# Patient Record
Sex: Female | Born: 1995 | Race: White | Hispanic: No | Marital: Married | Smoking: Never smoker
Health system: Southern US, Community
[De-identification: ages and names within clinical notes are randomized; demographics above are authoritative.]

## PROBLEM LIST (undated history)

## (undated) HISTORY — PX: FEMUR BIOPSY: SHX1592

---
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
## 2016-12-05 ENCOUNTER — Encounter: Payer: Self-pay | Admitting: Emergency Medicine

## 2016-12-05 ENCOUNTER — Other Ambulatory Visit: Payer: Self-pay

## 2016-12-05 ENCOUNTER — Emergency Department
Admission: EM | Admit: 2016-12-05 | Discharge: 2016-12-05 | Disposition: A | Discharge status: Home / Self Care | Payer: Managed Care, Other (non HMO) | Attending: Emergency Medicine | Admitting: Emergency Medicine

## 2016-12-05 DIAGNOSIS — Z79899 Other long term (current) drug therapy: Secondary | ICD-10-CM | POA: Diagnosis not present

## 2016-12-05 DIAGNOSIS — N764 Abscess of vulva: Secondary | ICD-10-CM | POA: Diagnosis not present

## 2016-12-05 DIAGNOSIS — R1031 Right lower quadrant pain: Secondary | ICD-10-CM | POA: Diagnosis present

## 2016-12-05 MED ORDER — LIDOCAINE HCL (PF) 1 % IJ SOLN
INTRAMUSCULAR | Status: AC
  Filled 2016-12-05: qty 5

## 2016-12-05 MED ORDER — SULFAMETHOXAZOLE-TRIMETHOPRIM 800-160 MG PO TABS: 1.0000 | ORAL_TABLET | Freq: Two times a day (BID) | ORAL | 0 refills | Status: AC

## 2016-12-05 MED ORDER — CEPHALEXIN 250 MG PO CAPS
250.0000 mg | ORAL_CAPSULE | Freq: Four times a day (QID) | ORAL | 0 refills | Status: AC
Start: 2016-12-05 — End: 2016-12-15

## 2016-12-05 NOTE — ED Triage Notes (Signed)
Abscess in groin

## 2016-12-05 NOTE — Discharge Instructions (Addendum)
You have been seen in the Emergency Department (ED) today for an abscess.  This was drained in the ED.  Please follow up with your doctor or in the ED in 24-48 hours for recheck of your wound.  Read through the additional discharge instructions included below regarding wound care recommendations.  Please follow-up with [facility] Monday or Tuesday for a wound check and to have your packing removed.  Call your doctor sooner or return to the ED if you develop worsening signs of infection such as: increased redness, increased pain, pus, or fever.

## 2016-12-05 NOTE — ED Provider Notes (Signed)
[facility] Emergency Department Provider Note   ____________________________________________   First MD Initiated Contact with Patient 12/05/16 1247     (approximate)  I have reviewed the triage vital signs and the nursing notes.   HISTORY  Chief Complaint Abscess    HPI Meghan Butler is a 21 y.o. female presents for evaluation of pain in the right lower groin.  Patient reports about 2 days ago she began realizing that she was having pain and discomfort around the right groin at the entrance of the vagina.  She was seen at student [facility] Where they diagnosed her with an abscess, she did not have it drained but they did squeeze it and were able to get a small amount of pus to come out.  We then placed her on Bactrim 1 tablet daily and she reports the swelling has continued to increase in pain increasing as well  No fevers or chills.  No nausea vomiting.  Denies pregnancy.  No urinary symptoms.  She reports is uncomfortable to walk.  No history of any abscess in the past.  No known drug allergies.  Currently on Bactrim 1 tablet twice a day  History reviewed. No pertinent past medical history.  There are no active problems to display for this patient.   Past Surgical History:  Procedure Laterality Date  . FEMUR BIOPSY      Prior to Admission medications   Medication Sig Start Date End Date Taking? Authorizing Provider  cephALEXin (KEFLEX) 250 MG capsule Take 1 capsule (250 mg total) by mouth 4 (four) times daily for 10 days. 12/05/16 12/15/16  Sharyn CreamerQuale, Sadako Cegielski, MD  HYDROcodone-acetaminophen (NORCO/VICODIN) 5-325 MG per tablet Take 2 tablets by mouth every 4 (four) hours as needed for moderate pain or severe pain. 12/10/13   Mesner, Barbara CowerJason, MD  Norethindrone Acetate-Ethinyl Estrad-FE (LOMEDIA 24 FE) 1-20 MG-MCG(24) tablet Take 1 tablet by mouth daily.    [provider]  sulfamethoxazole-trimethoprim (BACTRIM DS,SEPTRA DS) 800-160 MG tablet Take 1  tablet by mouth 2 (two) times daily. 12/05/16   Sharyn CreamerQuale, Heriberto Stmartin, MD    Allergies Patient has no known allergies.  History reviewed. No pertinent family history.  Social History Social History   Tobacco Use  . Smoking status: Never Smoker  . Smokeless tobacco: Never Used  Substance Use Topics  . Alcohol use: Yes    Comment: socially  . Drug use: No    Review of Systems Constitutional: No fever/chills Eyes: No visual changes. ENT: No sore throat. Cardiovascular: Denies chest pain. Respiratory: Denies shortness of breath. Gastrointestinal: No abdominal pain.  No nausea, no vomiting.  No diarrhea.  No constipation. Genitourinary: Negative for dysuria. Musculoskeletal: Negative for back pain. Skin: See HPI Neurological: Negative for headaches, focal weakness or numbness.    ____________________________________________   PHYSICAL EXAM:  VITAL SIGNS: ED Triage Vitals  Enc Vitals Group     BP 12/05/16 1151 118/74     Pulse Rate 12/05/16 1151 (!) 103     Resp 12/05/16 1151 (!) 22     Temp 12/05/16 1151 98.6 F (37 C)     Temp Source 12/05/16 1151 Oral     SpO2 12/05/16 1151 99 %     Weight 12/05/16 1156 135 lb (61.2 kg)     Height 12/05/16 1156 5\' 6"  (1.676 m)     Head Circumference --      Peak Flow --      Pain Score 12/05/16 1151 7  Pain Loc --      Pain Edu? --      Excl. in GC? --     Constitutional: Alert and oriented. Well appearing and in no acute distress. Eyes: Conjunctivae are normal. Head: Atraumatic. Nose: No congestion/rhinnorhea. Mouth/Throat: Mucous membranes are moist. Neck: No stridor.   Cardiovascular: Normal rate, regular rhythm.  Respiratory: Normal respiratory effort.  No retractions. Lungs CTAB. Gastrointestinal: Soft and nontender. No distention. Musculoskeletal: No lower extremity tenderness nor edema. Genitourinary: Examined with nurse Felicia throughout.  The left labia is normal.  The introitus appears normal.  The right labia  has an obvious area of induration and swelling at the anterior portion of the labia majora.  There is no swelling in the area of the Bartholin's gland.  The patient does have mild overlying erythema which is primarily seated over the area that is swollen and about 3 cm x 2 cm with minimal extension.  There is no extension into the remainder of the perineum.  No crepitance.  There is no purulence or drainage at this time Neurologic:  Normal speech and language. No gross focal neurologic deficits are appreciated.  Skin:  Skin is warm, dry and intact. No rash noted. Psychiatric: Mood and affect are normal. Speech and behavior are normal.  ____________________________________________   LABS (all labs ordered are listed, but only abnormal results are displayed)  Labs Reviewed - No data to display ____________________________________________  EKG   ____________________________________________  RADIOLOGY   ____________________________________________   PROCEDURES  Procedure(s) performed: Incision and drainage  .Marland KitchenIncision and Drainage Date/Time: 12/05/2016 4:15 PM Performed by: Sharyn Creamer, MD Authorized by: Sharyn Creamer, MD   Consent:    Consent obtained:  Verbal   Consent given by:  Patient   Risks discussed:  Bleeding, infection, incomplete drainage and pain   Alternatives discussed:  Alternative treatment, delayed treatment and observation Location:    Type:  Abscess   Size:  3 cm   Location: labia majora right, anterior portion. Pre-procedure details:    Skin preparation:  Betadine Sedation:    Sedation type: none, drove here and plans to drive self home. Anesthesia (see MAR for exact dosages):    Anesthesia method:  Local infiltration   Local anesthetic:  Lidocaine 1% w/o epi Procedure type:    Complexity:  Complex Procedure details:    Needle aspiration: no     Incision types:  Single straight   Incision depth:  Subcutaneous   Scalpel blade:  11   Wound  management:  Probed and deloculated   Drainage:  Purulent   Drainage amount:  Moderate   Wound treatment:  Wound left open   Packing materials:  1/2 in gauze   Amount 1/2":  1 foot with about 6 inches left external and taped to bandage Post-procedure details:    Patient tolerance of procedure:  Tolerated well, no immediate complications     Critical Care performed: No  ____________________________________________   INITIAL IMPRESSION / ASSESSMENT AND PLAN / ED COURSE  Pertinent labs & imaging results that were available during my care of the patient were reviewed by me and considered in my medical decision making (see chart for details).  Patient presents for evaluation of abscess.  Notable abscess over the labia majora.  Does not involve the Bartholin region.  There is no extension into the deeper perineum.  She is afebrile with stable vital signs and no systemic symptoms  She has a notable abscess that is palpable fluctuance along the anterior  labia majora.  After incision and drainage approximately 10-15 cc of purulent drainage was obtained without complication.  The area was bandaged and a wick was placed.  I discussed with the patient and I have increased her Bactrim to 2 tablets twice daily for which she already has a prescription for 1 tablet twice daily.  We will also place her on cephalexin for better streptococcal coverage.  There is no evidence of significant associated cellulitis, and after drainage I feel the patient should have good results and much better chance of healing then without I&D.  I discussed the case and care with both the patient, her sister as well as her mother who I spoke to over the phone at the patient request.  Return precautions and treatment recommendations and follow-up discussed with the patient who is agreeable with the plan.       ____________________________________________   FINAL CLINICAL IMPRESSION(S) / ED DIAGNOSES  Final diagnoses:    Abscess of right genital labia      NEW MEDICATIONS STARTED DURING THIS VISIT:  This SmartLink is deprecated. Use AVSMEDLIST instead to display the medication list for a patient.   Note:  This document was prepared using Dragon voice recognition software and may include unintentional dictation errors.     Sharyn CreamerQuale, Reiko Vinje, MD 12/05/16 Barry Brunner1935

## 2016-12-05 NOTE — ED Notes (Signed)
This RN at bedside with MD during examination. Right labia noted to be red and swollen with bloody, purulent drainage. Patient reports she was seen at [facility] and had the abscess "drained with her hands". Patient denies fever.

## 2016-12-05 NOTE — ED Notes (Signed)
First Nurse Note: Pt states that she has an abscess in her groin area since Friday.

## 2016-12-05 NOTE — ED Triage Notes (Signed)
Pt states abscess was drained and she was placed on antibiotic on Friday but that it came back bigger.

## 2016-12-06 ENCOUNTER — Other Ambulatory Visit: Payer: Self-pay

## 2016-12-06 ENCOUNTER — Encounter: Payer: Self-pay | Admitting: Obstetrics and Gynecology

## 2016-12-06 ENCOUNTER — Ambulatory Visit: Payer: Managed Care, Other (non HMO) | Admitting: Obstetrics and Gynecology

## 2016-12-06 VITALS — BP 104/72 | HR 86 | Ht 66.0 in | Wt 143.0 lb

## 2016-12-06 DIAGNOSIS — N764 Abscess of vulva: Secondary | ICD-10-CM | POA: Diagnosis not present

## 2016-12-06 DIAGNOSIS — Z9889 Other specified postprocedural states: Secondary | ICD-10-CM | POA: Diagnosis not present

## 2016-12-06 MED ORDER — LIDOCAINE HCL 2 % EX GEL: 1.0000 "application " | CUTANEOUS | 2 refills | Status: AC | PRN

## 2016-12-06 NOTE — Progress Notes (Signed)
Obstetrics & Gynecology Office Visit   Chief Complaint:  Chief Complaint  Patient presents with  . 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  At that time she also noted pain and swelling in the right labia/groin.  She was diagnosed with a labial abscess, at the time there was some drainage but she was managed conservatively/medically and started on bactrim/keflex.  Symptoms continued to worsen and she presented to the ED on 12/05/16 where she underwent an I&D.  Wound cultures were note collected at that time and she remains on empiric antibiotics.  Symptoms have since improved with significantly less swelling and discomfort.  She has not had any fevers.  Denies having similar episode previously.  Only identifiable risk factor is that the patient does shave in the area which could have caused a skin break.    Past Medical History:  History reviewed. No pertinent past medical history.  Past Surgical History:  Past Surgical History:  Procedure Laterality Date  . FEMUR BIOPSY      Gynecologic History: No LMP recorded. Patient is not currently having periods (Reason: Oral contraceptives).  Obstetric History: No obstetric history on file.  Family History:  History reviewed. No pertinent family history.  Social History:  Social History   Socioeconomic History  . Marital status: Married    Spouse name: Not on file  . Number of children: Not on file  . Years of education: Not on file  . Highest education level: Not on file  Social Needs  . Financial resource strain: Not on file  . Food insecurity - worry: Not on file  . Food insecurity - inability: Not on file  . Transportation needs - medical: Not on file  . Transportation needs - non-medical: Not on file  Occupational History  .  Not on file  Tobacco Use  . Smoking status: Never Smoker  . Smokeless tobacco: Never Used  Substance and Sexual Activity  . Alcohol use: Yes    Comment: socially  . Drug use: No  . Sexual activity: Yes    Birth control/protection: Pill  Other Topics Concern  . Not on file  Social History Narrative  . Not on file    Allergies:  No Known Allergies  Medications: Prior to Admission medications   Medication Sig Start Date End Date Taking? Authorizing Provider  cephALEXin (KEFLEX) 250 MG capsule Take 1 capsule (250 mg total) by mouth 4 (four) times daily for 10 days. 12/05/16 12/15/16 Yes Sharyn CreamerQuale, Mark, MD  Norethindrone Acetate-Ethinyl Estrad-FE (LOMEDIA 24 FE) 1-20 MG-MCG(24) tablet Take 1 tablet by mouth daily.   Yes [provider]  sulfamethoxazole-trimethoprim (BACTRIM DS,SEPTRA DS) 800-160 MG tablet Take 1 tablet by mouth 2 (two) times daily. 12/05/16  Yes Sharyn CreamerQuale, Mark, MD  HYDROcodone-acetaminophen (NORCO/VICODIN) 5-325 MG per tablet Take 2 tablets by mouth every 4 (four) hours as needed for moderate pain or severe pain. Patient not taking: Reported on 12/06/2016 12/10/13   Butler, Meghan CowerJason, MD  lidocaine (XYLOCAINE) 2 % jelly Apply 1 application topically as needed. 12/06/16   Vena AustriaStaebler, Meghan Traynham, MD    Physical Exam Vitals:  Vitals:   12/06/16 1002  BP: 104/72  Pulse: 86   No LMP recorded. Patient is not currently having periods (Reason: Oral contraceptives).  General: NAD HEENT: normocephalic,  anicteric  Pulmonary: No increased work of breathing Genitourinary:  External: Normal external female genitalia.  The right groin has mild erythema, no induration, there is a 1cm I&D site with packing material.  The edges look viable, the incision is approximately 1cm in depth with no tracking.  Wound culture obtained.  Repacked with 1/4" iodinated packing strips  Lymphatic: no evidence of inguinal lymphadenopathy Extremities: no edema, erythema, or tenderness Neurologic: Grossly  intact Psychiatric: mood appropriate, affect full  Female chaperone present for pelvic portions of the physical exam  Assessment: 21 y.o. with right vulvar abscess  Plan: Problem List Items Addressed This Visit    None    Visit Diagnoses    Vulvar abscess    -  Primary   Relevant Orders   Ambulatory referral to Home Health   Wound culture   Status post incision and drainage       Relevant Orders   Ambulatory referral to Home Health   Wound culture     - Set up home health - Continue bactrim and keflex, I&D 12/05/16 in ED - Cultured today  - Lidocaine jelly rx - No shaving until healed  - Daily packing changes for now, follow up daily until home health set up

## 2016-12-07 ENCOUNTER — Ambulatory Visit (INDEPENDENT_AMBULATORY_CARE_PROVIDER_SITE_OTHER): Payer: Managed Care, Other (non HMO) | Admitting: Obstetrics and Gynecology

## 2016-12-07 ENCOUNTER — Encounter: Payer: Self-pay | Admitting: Obstetrics and Gynecology

## 2016-12-07 DIAGNOSIS — N764 Abscess of vulva: Secondary | ICD-10-CM | POA: Diagnosis not present

## 2016-12-07 MED ORDER — HYDROCODONE-ACETAMINOPHEN 5-325 MG PO TABS: 1.0000 | ORAL_TABLET | Freq: Four times a day (QID) | ORAL | 0 refills | Status: AC | PRN

## 2016-12-07 NOTE — Progress Notes (Signed)
     Obstetrics & Gynecology Office Visit   Chief Complaint:  Chief Complaint  Patient presents with  . Wound Check    Dressing Change    History of Present Illness: 21 year old presenting for follow up packing change from vulvar I&D in ED.  No fevers, no chills.  Slightly less tender.   Review of Systems: Review of systems negative unless otherwise noted in HPI  Past Medical History:  History reviewed. No pertinent past medical history.  Past Surgical History:  Past Surgical History:  Procedure Laterality Date  . FEMUR BIOPSY      Gynecologic History: No LMP recorded. Patient is not currently having periods (Reason: Oral contraceptives).  Obstetric History: No obstetric history on file.  Family History:  History reviewed. No pertinent family history.  Social History:  Social History   Socioeconomic History  . Marital status: Married    Spouse name: Not on file  . Number of children: Not on file  . Years of education: Not on file  . Highest education level: Not on file  Social Needs  . Financial resource strain: Not on file  . Food insecurity - worry: Not on file  . Food insecurity - inability: Not on file  . Transportation needs - medical: Not on file  . Transportation needs - non-medical: Not on file  Occupational History  . Not on file  Tobacco Use  . Smoking status: Never Smoker  . Smokeless tobacco: Never Used  Substance and Sexual Activity  . Alcohol use: Yes    Comment: socially  . Drug use: No  . Sexual activity: Yes    Birth control/protection: Pill  Other Topics Concern  . Not on file  Social History Narrative  . Not on file    Allergies:  No Known Allergies  Medications: Prior to Admission medications   Medication Sig Start Date End Date Taking? Authorizing Provider  cephALEXin (KEFLEX) 250 MG capsule Take 1 capsule (250 mg total) by mouth 4 (four) times daily for 10 days. 12/05/16 12/15/16  Sharyn CreamerQuale, Mark, MD  HYDROcodone-acetaminophen  (NORCO/VICODIN) 5-325 MG tablet Take 1 tablet by mouth every 6 (six) hours as needed. 12/07/16   Vena AustriaStaebler, Izear Pine, MD  lidocaine (XYLOCAINE) 2 % jelly Apply 1 application topically as needed. 12/06/16   Vena AustriaStaebler, Kennidee Heyne, MD  Norethindrone Acetate-Ethinyl Estrad-FE (LOMEDIA 24 FE) 1-20 MG-MCG(24) tablet Take 1 tablet by mouth daily.    [provider]  sulfamethoxazole-trimethoprim (BACTRIM DS,SEPTRA DS) 800-160 MG tablet Take 1 tablet by mouth 2 (two) times daily. 12/05/16   Sharyn CreamerQuale, Mark, MD    Physical Exam Vitals: There were no vitals filed for this visit. No LMP recorded. Patient is not currently having periods (Reason: Oral contraceptives).  General: NAD HEENT: normocephalic, anicteric Pulmonary: No increased work of breathing Genitourinary:  External: Normal external female genitalia.  Normal urethral meatus, normal Bartholin's and Skene's glands.  The I&D site is clean, with decreasing induration, erythema completely resolved, no discharge, wick removed and replaced.  Rectal: deferred  Lymphatic: no evidence of inguinal lymphadenopathy Extremities: no edema, erythema, or tenderness Neurologic: Grossly intact Psychiatric: mood appropriate, affect full  Female chaperone present for pelvic portions of the physical exam  Assessment: 21 y.o. dressing change from ED I&D  Plan: Problem List Items Addressed This Visit    None     - Culture pending - Packing changed - Home health pending anticipate packing changes till end of the week - Rx vicodin

## 2016-12-08 ENCOUNTER — Ambulatory Visit (INDEPENDENT_AMBULATORY_CARE_PROVIDER_SITE_OTHER): Payer: Managed Care, Other (non HMO) | Admitting: Obstetrics and Gynecology

## 2016-12-08 ENCOUNTER — Encounter: Payer: Self-pay | Admitting: Obstetrics and Gynecology

## 2016-12-08 DIAGNOSIS — N764 Abscess of vulva: Secondary | ICD-10-CM | POA: Insufficient documentation

## 2016-12-08 NOTE — Progress Notes (Signed)
Culture pending Packing changed No erythema, mild induration, moderate tenderness.  Attempted to probe wound, but patient could not tolerate.  However, it does not appear by palpation there are any deep collections, only induration.   Home health pending, though is appears unlikely she will get this accomplished. anticipate packing changes till end of the week Continue abx.   Thomasene MohairStephen Aniylah Avans, MD 12/08/2016 3:54 PM

## 2016-12-09 ENCOUNTER — Encounter: Payer: Self-pay | Admitting: Obstetrics and Gynecology

## 2016-12-09 ENCOUNTER — Ambulatory Visit (INDEPENDENT_AMBULATORY_CARE_PROVIDER_SITE_OTHER): Payer: Managed Care, Other (non HMO) | Admitting: Obstetrics and Gynecology

## 2016-12-09 VITALS — BP 114/70 | Ht 65.0 in

## 2016-12-09 DIAGNOSIS — N764 Abscess of vulva: Secondary | ICD-10-CM

## 2016-12-09 LAB — WOUND CULTURE: Organism ID, Bacteria: NONE SEEN

## 2016-12-09 NOTE — Progress Notes (Signed)
Culture with no growth in 48 hours. Packing changed No erythema, mild induration at the posterior base, moderate tenderness.  It does not appear by palpation there are any deep collections, only induration.   Home health pending, though is appears unlikely she will get this accomplished. anticipate packing changes till end of the week as the wound is very shallow.   Thomasene MohairStephen Jackson, MD 12/09/2016 3:28 PM

## 2016-12-10 ENCOUNTER — Encounter: Payer: Self-pay | Admitting: Obstetrics and Gynecology

## 2016-12-10 ENCOUNTER — Ambulatory Visit (INDEPENDENT_AMBULATORY_CARE_PROVIDER_SITE_OTHER): Payer: Managed Care, Other (non HMO) | Admitting: Obstetrics and Gynecology

## 2016-12-10 VITALS — BP 122/70

## 2016-12-10 DIAGNOSIS — N764 Abscess of vulva: Secondary | ICD-10-CM | POA: Diagnosis not present

## 2016-12-10 NOTE — Progress Notes (Signed)
Culture with no growth in 48 hours. Packing changed No erythema, mild induration at the posterior base, moderate tenderness.  It does not appear by palpation there are any deep collections, only induration.   She may remove packing tomorrow. F/U one week to verify continued healing. Vulvar abscess improving and likely nearly resolved.   Thomasene MohairStephen Georgeanna Radziewicz, MD 12/10/2016 3:42 PM

## 2016-12-16 ENCOUNTER — Ambulatory Visit: Payer: Managed Care, Other (non HMO) | Admitting: Obstetrics and Gynecology
